# Patient Record
Sex: Female | Born: 1935 | Race: White | Hispanic: No | Smoking: Never smoker
Health system: Southern US, Community
[De-identification: ages and names within clinical notes are randomized; demographics above are authoritative.]

## PROBLEM LIST (undated history)

## (undated) DIAGNOSIS — J449 Chronic obstructive pulmonary disease, unspecified: Secondary | ICD-10-CM

## (undated) DIAGNOSIS — I6523 Occlusion and stenosis of bilateral carotid arteries: Secondary | ICD-10-CM

## (undated) DIAGNOSIS — F4323 Adjustment disorder with mixed anxiety and depressed mood: Secondary | ICD-10-CM

## (undated) DIAGNOSIS — M1990 Unspecified osteoarthritis, unspecified site: Secondary | ICD-10-CM

## (undated) DIAGNOSIS — I10 Essential (primary) hypertension: Secondary | ICD-10-CM

## (undated) HISTORY — PX: ABDOMINAL HYSTERECTOMY: SHX81

## (undated) HISTORY — PX: CHOLECYSTECTOMY: SHX55

---

## 2013-06-05 ENCOUNTER — Encounter (INDEPENDENT_AMBULATORY_CARE_PROVIDER_SITE_OTHER): Payer: Self-pay | Admitting: Ophthalmology

## 2013-06-05 ENCOUNTER — Encounter (INDEPENDENT_AMBULATORY_CARE_PROVIDER_SITE_OTHER): Payer: Medicare Other | Admitting: Ophthalmology

## 2013-06-05 DIAGNOSIS — H3530 Unspecified macular degeneration: Secondary | ICD-10-CM

## 2013-06-05 DIAGNOSIS — H353290 Exudative age-related macular degeneration, unspecified eye, stage unspecified: Secondary | ICD-10-CM

## 2013-06-05 DIAGNOSIS — I10 Essential (primary) hypertension: Secondary | ICD-10-CM

## 2013-06-05 DIAGNOSIS — H43819 Vitreous degeneration, unspecified eye: Secondary | ICD-10-CM

## 2013-06-05 DIAGNOSIS — H35039 Hypertensive retinopathy, unspecified eye: Secondary | ICD-10-CM

## 2017-10-10 ENCOUNTER — Encounter (HOSPITAL_BASED_OUTPATIENT_CLINIC_OR_DEPARTMENT_OTHER): Payer: Self-pay | Admitting: Emergency Medicine

## 2017-10-10 ENCOUNTER — Emergency Department (HOSPITAL_BASED_OUTPATIENT_CLINIC_OR_DEPARTMENT_OTHER)
Admission: EM | Admit: 2017-10-10 | Discharge: 2017-10-10 | Disposition: A | Discharge status: Home / Self Care | Payer: Medicare Other | Attending: Emergency Medicine | Admitting: Emergency Medicine

## 2017-10-10 ENCOUNTER — Emergency Department (HOSPITAL_BASED_OUTPATIENT_CLINIC_OR_DEPARTMENT_OTHER): Payer: Medicare Other

## 2017-10-10 ENCOUNTER — Other Ambulatory Visit: Payer: Self-pay

## 2017-10-10 DIAGNOSIS — J449 Chronic obstructive pulmonary disease, unspecified: Secondary | ICD-10-CM | POA: Insufficient documentation

## 2017-10-10 DIAGNOSIS — R109 Unspecified abdominal pain: Secondary | ICD-10-CM | POA: Diagnosis present

## 2017-10-10 DIAGNOSIS — I10 Essential (primary) hypertension: Secondary | ICD-10-CM | POA: Diagnosis not present

## 2017-10-10 DIAGNOSIS — E278 Other specified disorders of adrenal gland: Secondary | ICD-10-CM | POA: Diagnosis not present

## 2017-10-10 DIAGNOSIS — Z79899 Other long term (current) drug therapy: Secondary | ICD-10-CM | POA: Diagnosis not present

## 2017-10-10 HISTORY — DX: Essential (primary) hypertension: I10

## 2017-10-10 HISTORY — DX: Chronic obstructive pulmonary disease, unspecified: J44.9

## 2017-10-10 HISTORY — DX: Occlusion and stenosis of bilateral carotid arteries: I65.23

## 2017-10-10 HISTORY — DX: Unspecified osteoarthritis, unspecified site: M19.90

## 2017-10-10 HISTORY — DX: Adjustment disorder with mixed anxiety and depressed mood: F43.23

## 2017-10-10 LAB — CBC WITH DIFFERENTIAL/PLATELET
BASOS ABS: 0 10*3/uL (ref 0.0–0.1)
BASOS PCT: 0 %
EOS ABS: 0 10*3/uL (ref 0.0–0.7)
EOS PCT: 0 %
HCT: 40.8 % (ref 36.0–46.0)
HEMOGLOBIN: 13.9 g/dL (ref 12.0–15.0)
Lymphocytes Relative: 6 %
Lymphs Abs: 0.8 10*3/uL (ref 0.7–4.0)
MCH: 30.9 pg (ref 26.0–34.0)
MCHC: 34.1 g/dL (ref 30.0–36.0)
MCV: 90.7 fL (ref 78.0–100.0)
Monocytes Absolute: 0.4 10*3/uL (ref 0.1–1.0)
Monocytes Relative: 3 %
NEUTROS PCT: 91 %
Neutro Abs: 11 10*3/uL — ABNORMAL HIGH (ref 1.7–7.7)
PLATELETS: 259 10*3/uL (ref 150–400)
RBC: 4.5 MIL/uL (ref 3.87–5.11)
RDW: 13 % (ref 11.5–15.5)
WBC: 12.2 10*3/uL — AB (ref 4.0–10.5)

## 2017-10-10 LAB — URINALYSIS, MICROSCOPIC (REFLEX)

## 2017-10-10 LAB — BASIC METABOLIC PANEL
Anion gap: 10 (ref 5–15)
BUN: 34 mg/dL — AB (ref 6–20)
CALCIUM: 9.2 mg/dL (ref 8.9–10.3)
CO2: 22 mmol/L (ref 22–32)
CREATININE: 1.05 mg/dL — AB (ref 0.44–1.00)
Chloride: 107 mmol/L (ref 101–111)
GFR, EST AFRICAN AMERICAN: 53 mL/min — AB (ref >60)
GFR, EST NON AFRICAN AMERICAN: 46 mL/min — AB (ref >60)
Glucose, Bld: 143 mg/dL — ABNORMAL HIGH (ref 65–99)
Potassium: 5.1 mmol/L (ref 3.5–5.1)
Sodium: 139 mmol/L (ref 135–145)

## 2017-10-10 LAB — URINALYSIS, ROUTINE W REFLEX MICROSCOPIC
Glucose, UA: NEGATIVE mg/dL
HGB URINE DIPSTICK: NEGATIVE
Ketones, ur: NEGATIVE mg/dL
NITRITE: NEGATIVE
PROTEIN: NEGATIVE mg/dL
Specific Gravity, Urine: 1.02 (ref 1.005–1.030)
pH: 7 (ref 5.0–8.0)

## 2017-10-10 MED ORDER — HYDROCODONE-ACETAMINOPHEN 5-325 MG PO TABS: 1.0000 | ORAL_TABLET | Freq: Four times a day (QID) | ORAL | 0 refills | Status: AC | PRN

## 2017-10-10 MED ORDER — HYDROCODONE-ACETAMINOPHEN 5-325 MG PO TABS
1.0000 | ORAL_TABLET | Freq: Once | ORAL | Status: AC
  Administered 2017-10-10: 1 via ORAL
  Filled 2017-10-10: qty 1

## 2017-10-10 NOTE — Discharge Instructions (Addendum)
Outpatient MRI on Saturday as scheduled.  Hydrocodone as prescribed as needed for pain.  Return to the emergency department if you develop worsening pain, high fever, or other new and concerning symptoms.

## 2017-10-10 NOTE — ED Triage Notes (Signed)
Pt to ED via EMS with c/o left flank pain x 2 hours.

## 2017-10-10 NOTE — ED Notes (Signed)
ED Provider at bedside. 

## 2017-10-10 NOTE — ED Provider Notes (Signed)
[facility] EMERGENCY DEPARTMENT Provider Note   CSN: [id number] Arrival date & time: 10/10/17  0255     History   Chief Complaint Chief Complaint  Patient presents with  . Flank Pain    HPI Cindy Hickman is a 84 y.o. female.  Patient is an 84 year old female with past medical history of arthritis, COPD, hypertension.  She was brought by EMS for evaluation of left flank pain.  She woke from sleep this evening with severe pain to her left lateral abdomen.  She describes the pain as cramping and initially severe.  She denies nausea or vomiting.  She denies any bowel or bladder complaints.  She denies any fevers or chills.     Past Medical History:  Diagnosis Date  . Adjustment disorder with mixed anxiety and depressed mood   . Arthritis   . Bilateral carotid artery stenosis   . COPD (chronic obstructive pulmonary disease) (HCC)   . Hypertension     There are no active problems to display for this patient.   Past Surgical History:  Procedure Laterality Date  . ABDOMINAL HYSTERECTOMY    . CHOLECYSTECTOMY       OB History   None      Home Medications    Prior to Admission medications   Medication Sig Start Date End Date Taking? Authorizing Provider  etodolac (LODINE) 400 MG tablet Take 400 mg by mouth daily.   Yes [provider]  lisinopril-hydrochlorothiazide (PRINZIDE,ZESTORETIC) 10-12.5 MG tablet Take 1 tablet by mouth daily.   Yes [provider]  traMADol (ULTRAM) 50 MG tablet Take by mouth every 6 (six) hours as needed.   Yes [provider]    Family History No family history on file.  Social History Social History   Tobacco Use  . Smoking status: Never Smoker  . Smokeless tobacco: Never Used  Substance Use Topics  . Alcohol use: Yes    Frequency: Never  . Drug use: Never     Allergies   Ciprofloxacin; Mercury; Nitorfurantoin macrocrystalline [nitrofurantoin]; Penicillins; and Statins   Review of  Systems Review of Systems  All other systems reviewed and are negative.    Physical Exam Updated Vital Signs BP (!) 188/80 (BP Location: Right Arm)   Pulse 79   Temp 97.8 F (36.6 C) (Oral)   Resp 18   Ht  (1.499 m)   Wt 66.7 kg (147 lb)   SpO2 96%   BMI 29.69 kg/m   Physical Exam  Constitutional: She is oriented to person, place, and time. She appears well-developed and well-nourished. No distress.  HENT:  Head: Normocephalic and atraumatic.  Neck: Normal range of motion. Neck supple.  Cardiovascular: Normal rate and regular rhythm. Exam reveals no gallop and no friction rub.  No murmur heard. Pulmonary/Chest: Effort normal and breath sounds normal. No respiratory distress. She has no wheezes.  Abdominal: Soft. Bowel sounds are normal. She exhibits no distension. There is tenderness. There is no rebound and no guarding.  There is tenderness to palpation in the left upper quadrant and left lateral abdomen.  Musculoskeletal: Normal range of motion.  Neurological: She is alert and oriented to person, place, and time.  Skin: Skin is warm and dry. She is not diaphoretic.  Nursing note and vitals reviewed.    ED Treatments / Results  Labs (all labs ordered are listed, but only abnormal results are displayed) Labs Reviewed  URINALYSIS, ROUTINE W REFLEX MICROSCOPIC - Abnormal; Notable for the following  components:      Result Value   APPearance HAZY (*)    Bilirubin Urine MODERATE (*)    Leukocytes, UA MODERATE (*)    All other components within normal limits  URINALYSIS, MICROSCOPIC (REFLEX) - Abnormal; Notable for the following components:   Bacteria, UA FEW (*)    All other components within normal limits    EKG None  Radiology No results found.  Procedures Procedures (including critical care time)  Medications Ordered in ED Medications  HYDROcodone-acetaminophen (NORCO/VICODIN) 5-325 MG per tablet 1 tablet (has no administration in time range)      Initial Impression / Assessment and Plan / ED Course  I have reviewed the triage vital signs and the nursing notes.  Pertinent labs & imaging results that were available during my care of the patient were reviewed by me and considered in my medical decision making (see chart for details).  Patient presents with sudden onset left flank pain.  Her initial presentation was most consistent with a renal calculus, however CT scan does not reveal this.  It does reveal a 3.4 x 3.1 cm mass adjacent to the left adrenal gland with surrounding soft tissue inflammation.  Adrenal protocol MRI has been recommended.  This will be ordered.  The patient is feeling much better after receiving pain medication.  She will be discharged with arrangements for an outpatient MRI.  Final Clinical Impressions(s) / ED Diagnoses   Final diagnoses:  None    ED Discharge Orders    None       Geoffery Lyons, MD 10/10/17 [phone]

## 2017-10-13 ENCOUNTER — Ambulatory Visit (HOSPITAL_BASED_OUTPATIENT_CLINIC_OR_DEPARTMENT_OTHER)
Admission: RE | Admit: 2017-10-13 | Discharge: 2017-10-13 | Disposition: A | Discharge status: Home / Self Care | Payer: Medicare Other | Source: Ambulatory Visit | Attending: Emergency Medicine | Admitting: Emergency Medicine

## 2017-10-13 DIAGNOSIS — I700 Atherosclerosis of aorta: Secondary | ICD-10-CM | POA: Diagnosis not present

## 2017-10-13 DIAGNOSIS — R109 Unspecified abdominal pain: Secondary | ICD-10-CM | POA: Insufficient documentation

## 2017-10-13 DIAGNOSIS — R918 Other nonspecific abnormal finding of lung field: Secondary | ICD-10-CM | POA: Insufficient documentation

## 2017-10-13 DIAGNOSIS — E279 Disorder of adrenal gland, unspecified: Secondary | ICD-10-CM | POA: Diagnosis not present

## 2017-10-13 MED ORDER — GADOBENATE DIMEGLUMINE 529 MG/ML IV SOLN
15.0000 mL | Freq: Once | INTRAVENOUS | Status: AC | PRN
  Administered 2017-10-13: 13 mL via INTRAVENOUS

## 2019-04-15 IMAGING — CT CT RENAL STONE PROTOCOL
2 of 4 series · 16 of 46 positions shown, 18 images · non-contrast
Comparison: None.

CLINICAL DATA: Acute onset of left flank pain.

EXAM:
CT ABDOMEN AND PELVIS WITHOUT CONTRAST
TECHNIQUE: Multidetector CT imaging of the abdomen and pelvis was performed
following the standard protocol without IV contrast.

[Series 2: axial st · axial · 0.86mm/px · z∈[-413,-33]mm · 13 of 84 slices shown, 15 images]
[im 4/84  soft-tissue]
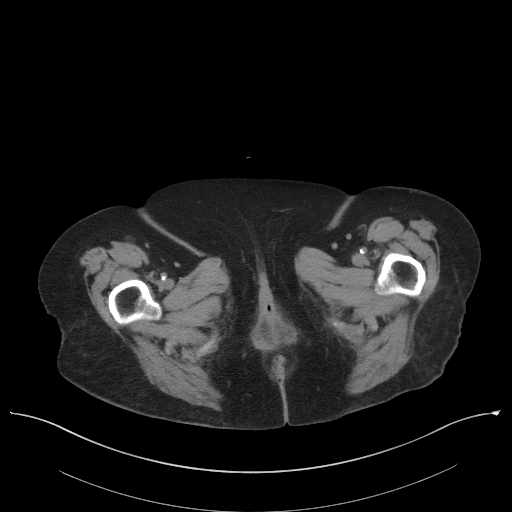
[im 4/84  bone]
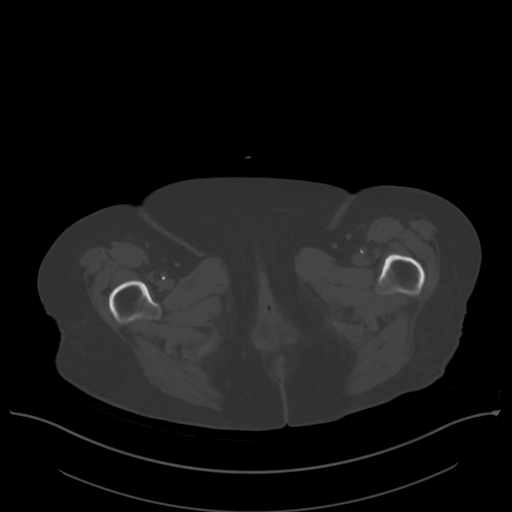
[im 11/84  soft-tissue]
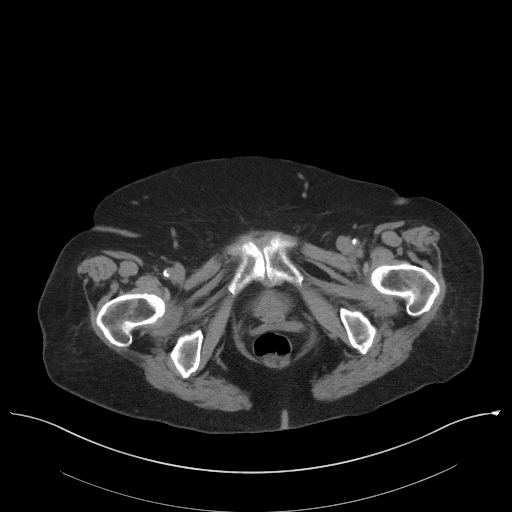
[im 18/84  soft-tissue]
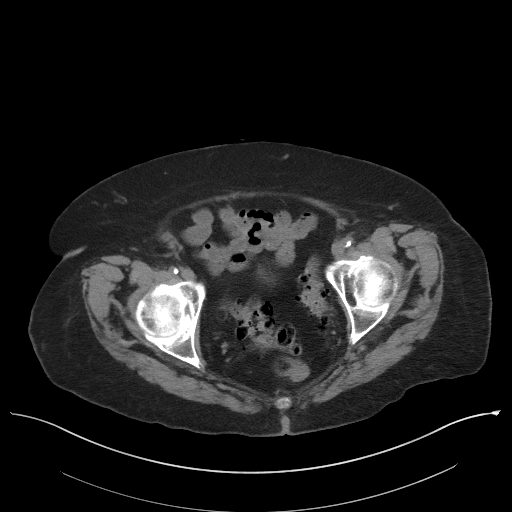
[im 25/84  soft-tissue]
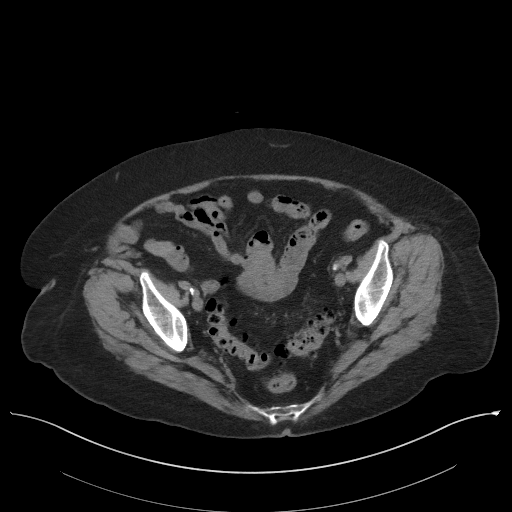
[im 28/84  soft-tissue]
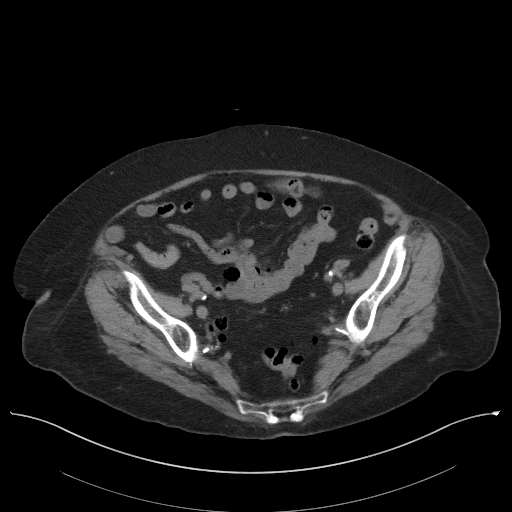
[im 35/84  soft-tissue]
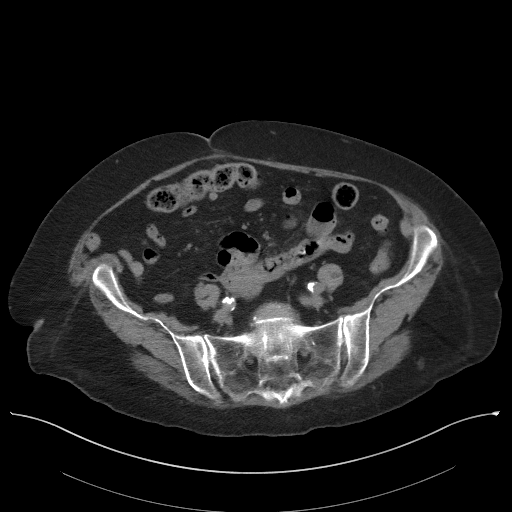
[im 42/84  soft-tissue]
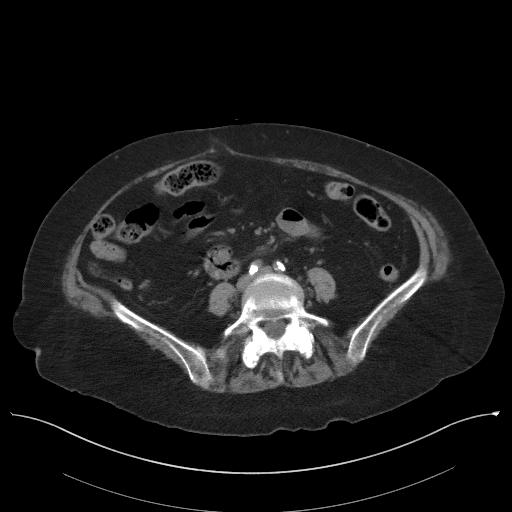
[im 49/84  soft-tissue]
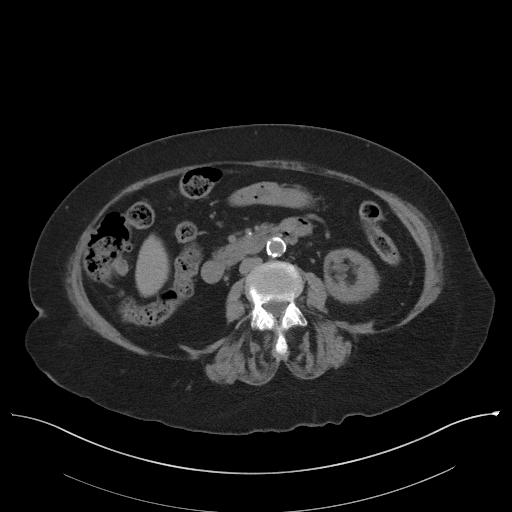
[im 56/84  soft-tissue]
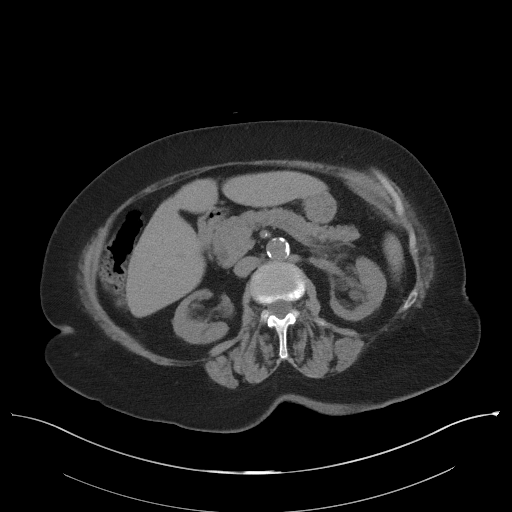
[im 56/84  bone]
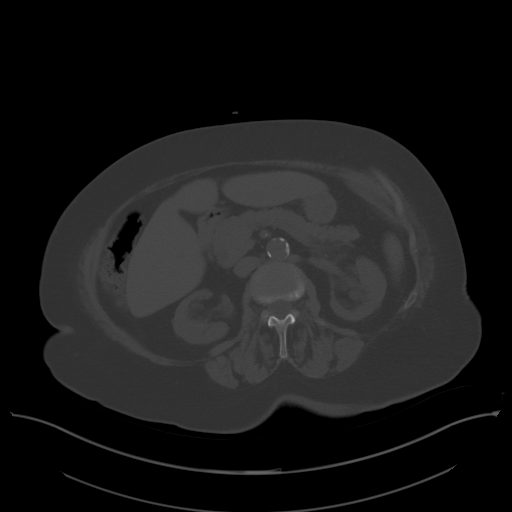
[im 59/84  soft-tissue]
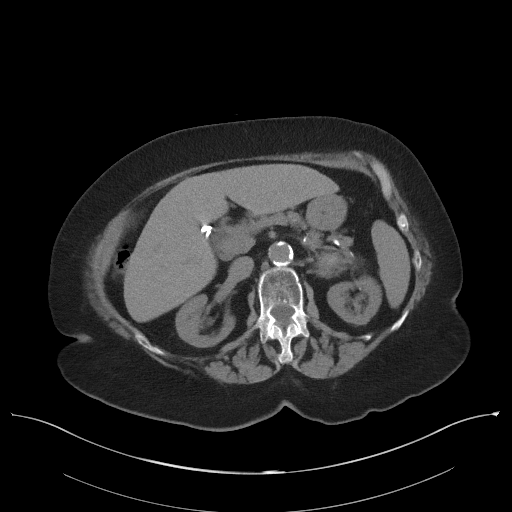
[im 66/84  soft-tissue]
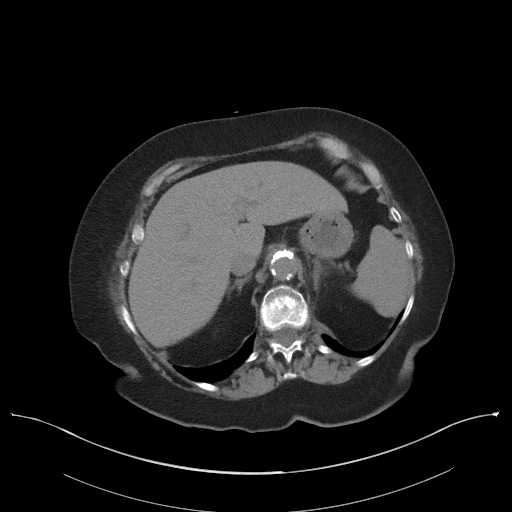
[im 73/84  soft-tissue]
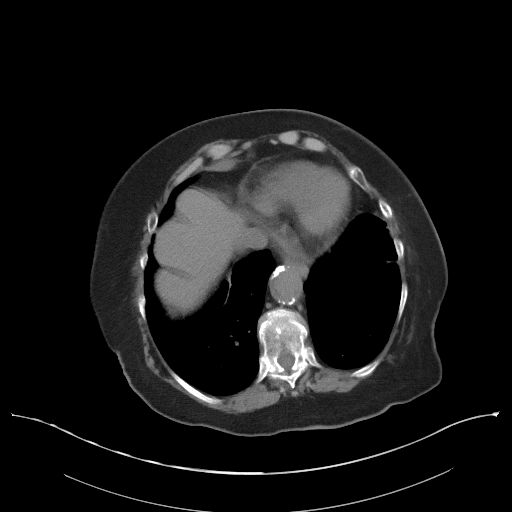
[im 80/84  soft-tissue]
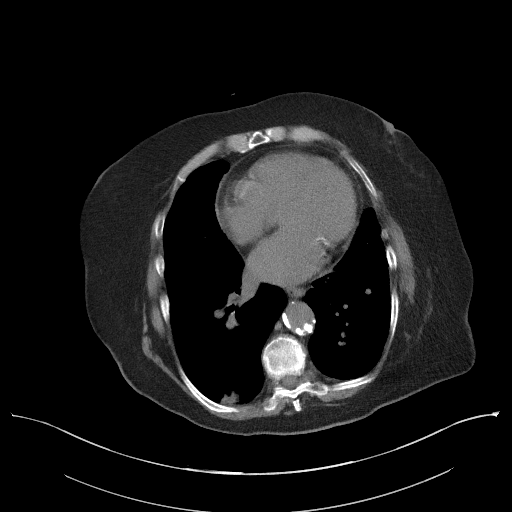

[Series 4: coronal st · coronal · 0.79mm/px · 3 of 84 slices shown]
[im 28/84  soft-tissue]
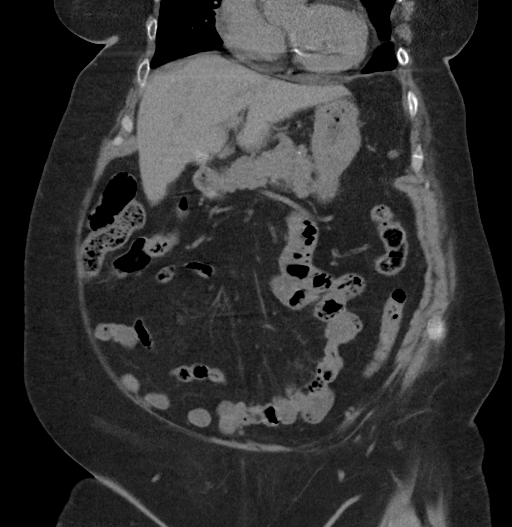
[im 37/84  soft-tissue]
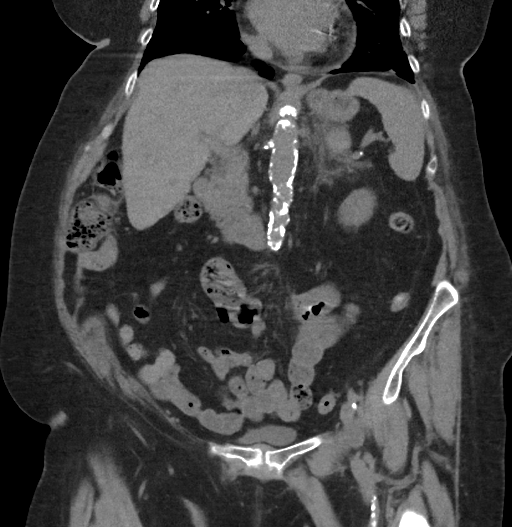
[im 47/84  soft-tissue]
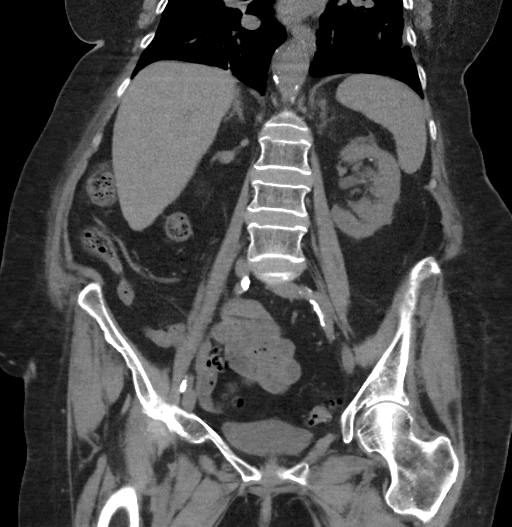

[16 of 46 positions shown; findings below may reference images not displayed]

FINDINGS: Lower chest: Focal airspace opacity at the posterior aspect of the
right lower lobe raises concern for pneumonia. Bibasilar atelectasis
is noted.

Diffuse coronary artery calcifications are seen. Calcification is
noted at the mitral valve.

Hepatobiliary: The liver is unremarkable in appearance. The patient
is status post cholecystectomy, with clips noted at the gallbladder
fossa. The common bile duct remains normal in caliber.

Pancreas: The pancreas is within normal limits.

Spleen: The spleen is unremarkable in appearance.

Adrenals/Urinary Tract: A 3.4 x 3.1 cm mass is noted adjacent to the
left adrenal gland, with surrounding soft tissue inflammation. The
right adrenal gland is unremarkable.

Nonspecific perinephric stranding is noted on the left. A few
scattered left renal parapelvic cysts are noted. There is no
evidence of hydronephrosis. No renal or ureteral stones are
identified.

Stomach/Bowel: The stomach is unremarkable in appearance. The small
bowel is within normal limits. The appendix is normal in caliber,
without evidence of appendicitis.

Scattered diverticulosis is noted along the ascending and sigmoid
colon, without evidence of diverticulitis.

Vascular/Lymphatic: Scattered calcification is seen along the
abdominal aorta and its branches. The abdominal aorta is otherwise
grossly unremarkable. The inferior vena cava is grossly
unremarkable. No retroperitoneal lymphadenopathy is seen. No pelvic
sidewall lymphadenopathy is identified.

Reproductive: The bladder is mildly distended and grossly
unremarkable. The patient is status post hysterectomy. No suspicious
adnexal masses are seen.

Other: No additional soft tissue abnormalities are seen.

Musculoskeletal: No acute osseous abnormalities are identified.
There is mild chronic loss of height at vertebral bodies T11 and L1.
The visualized musculature is unremarkable in appearance.
IMPRESSION: 1. 3.4 x 3.1 cm mass noted adjacent to the left adrenal gland, with
surrounding soft tissue inflammation. Would correlate with adrenal
labs. Adrenal protocol MRI or CT would be helpful for further
evaluation, to exclude malignancy.
2. Focal airspace opacity at the posterior aspect of the right lower
lung lobe raises concern for pneumonia. Followup PA and lateral
chest X-ray is recommended in 3-4 weeks following trial of
antibiotic therapy to ensure resolution and exclude underlying
malignancy.
3. Diffuse coronary artery calcifications seen. Calcification at the
mitral valve.
4. Few scattered left renal parapelvic cysts noted.
5. Scattered diverticulosis along the ascending and sigmoid colon,
without evidence of diverticulitis.
6. Mild chronic loss of height at vertebral bodies T11 and L1.

Aortic Atherosclerosis ([id number]-MML.L).

## 2021-06-15 DEATH — deceased
